# Patient Record
Sex: Male | Born: 1990 | Race: White | Hispanic: No | Marital: Single | Smoking: Never smoker
Health system: Southern US, Community
[De-identification: ages and names within clinical notes are randomized; demographics above are authoritative.]

## PROBLEM LIST (undated history)

## (undated) DIAGNOSIS — F32A Depression, unspecified: Secondary | ICD-10-CM

## (undated) DIAGNOSIS — F329 Major depressive disorder, single episode, unspecified: Secondary | ICD-10-CM

## (undated) DIAGNOSIS — Z8489 Family history of other specified conditions: Secondary | ICD-10-CM

## (undated) DIAGNOSIS — K5120 Ulcerative (chronic) proctitis without complications: Secondary | ICD-10-CM

## (undated) HISTORY — PX: WISDOM TOOTH EXTRACTION: SHX21

## (undated) HISTORY — PX: SHOULDER SURGERY: SHX246

## (undated) HISTORY — DX: Ulcerative (chronic) proctitis without complications: K51.20

## (undated) HISTORY — DX: Depression, unspecified: F32.A

## (undated) HISTORY — PX: MYRINGOTOMY: SUR874

## (undated) HISTORY — DX: Major depressive disorder, single episode, unspecified: F32.9

---
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
## 2012-03-04 ENCOUNTER — Other Ambulatory Visit: Payer: Self-pay | Admitting: Pediatrics

## 2012-03-04 DIAGNOSIS — K5120 Ulcerative (chronic) proctitis without complications: Secondary | ICD-10-CM

## 2012-03-06 NOTE — Telephone Encounter (Signed)
Here's one 

## 2013-03-14 ENCOUNTER — Telehealth: Payer: Self-pay | Admitting: Pediatrics

## 2013-03-14 NOTE — Telephone Encounter (Signed)
Here's one 

## 2013-03-26 ENCOUNTER — Other Ambulatory Visit: Payer: Self-pay | Admitting: Pediatrics

## 2013-03-26 DIAGNOSIS — K5120 Ulcerative (chronic) proctitis without complications: Secondary | ICD-10-CM

## 2013-03-26 MED ORDER — MESALAMINE ER 500 MG PO CPCR: 1000.0000 mg | ORAL_CAPSULE | Freq: Two times a day (BID) | ORAL | Status: DC

## 2013-04-02 NOTE — Telephone Encounter (Signed)
Dr Chestine Spore talked to [facility]

## 2013-12-30 IMAGING — US US ABDOMEN COMPLETE
1 series · 14 of 25 positions shown · non-contrast
Comparison: None

CLINICAL DATA: Abdominal pain.  Ulcerative proctitis.

COMPLETE ABDOMINAL ULTRASOUND

[Series 1: us abdomen complete · 0.22mm/px · 14 of 89 slices shown]
[im 1/89]
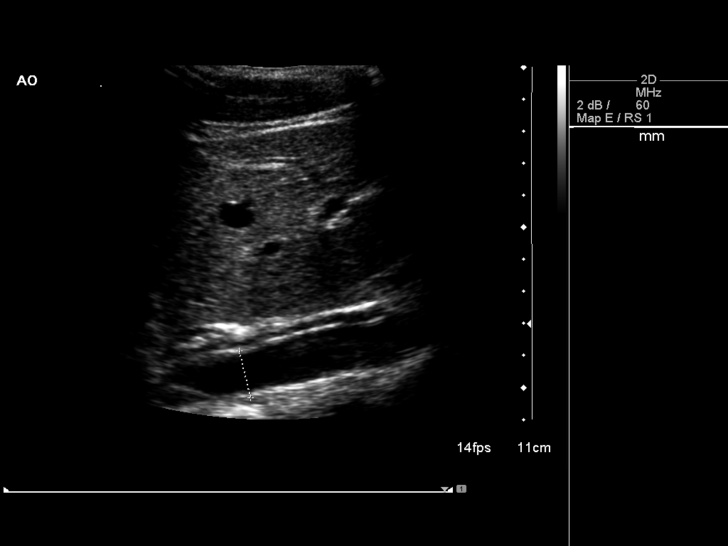
[im 8/89]
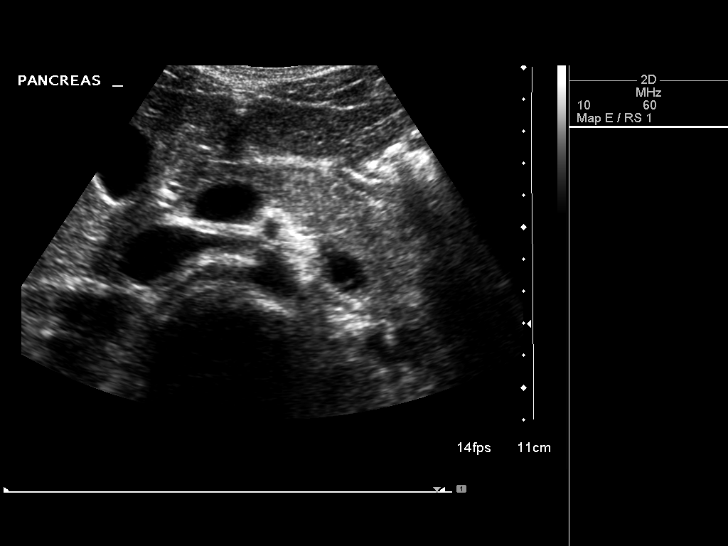
[im 15/89]
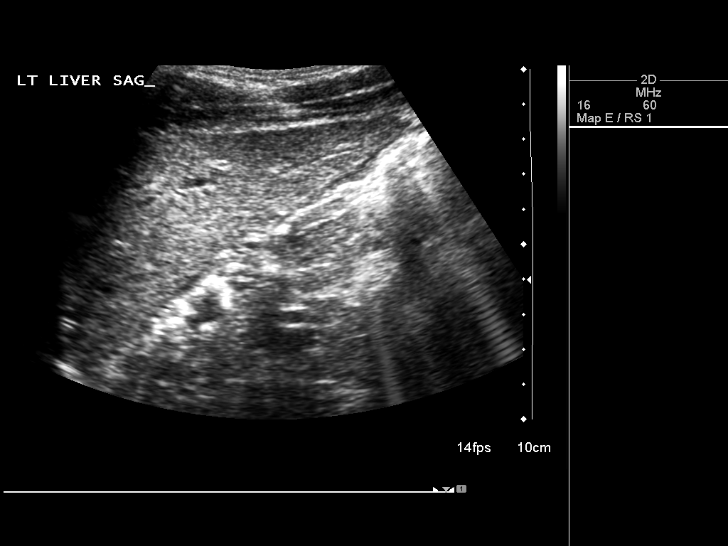
[im 23/89]
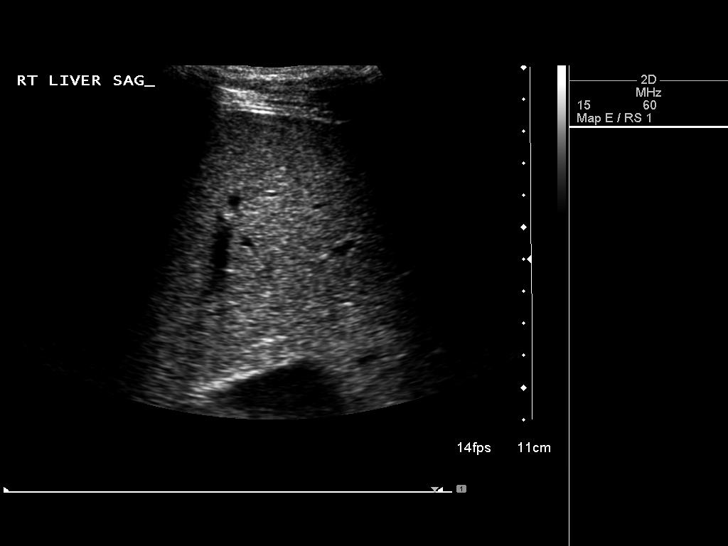
[im 30/89]
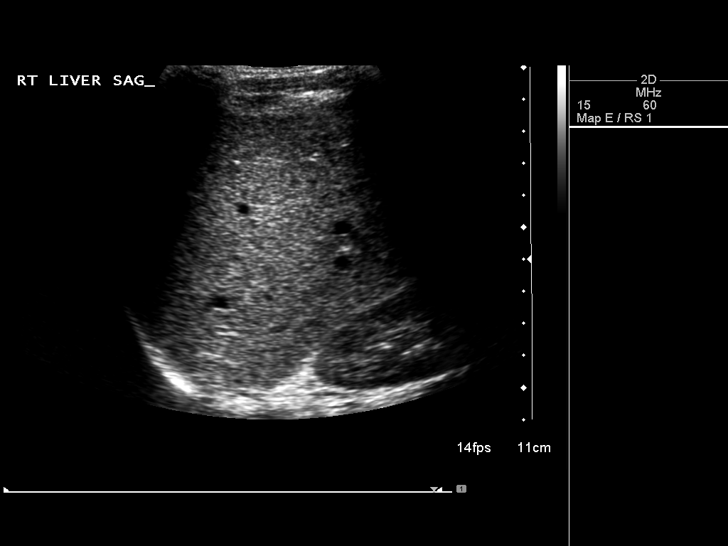
[im 34/89]
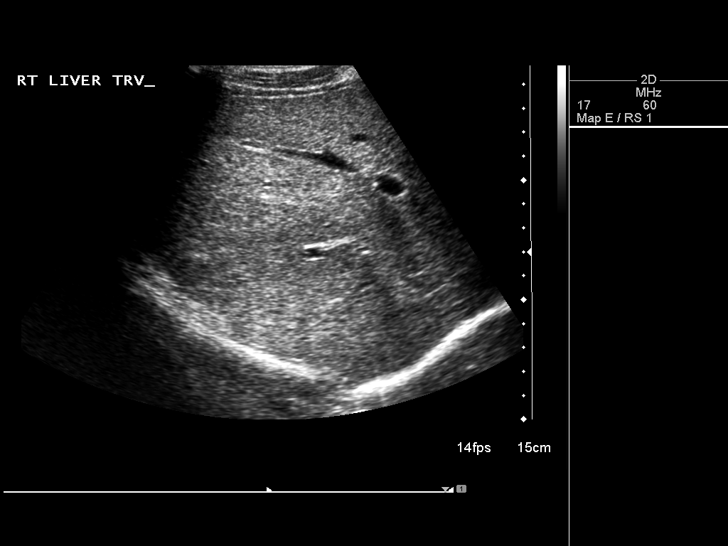
[im 41/89]
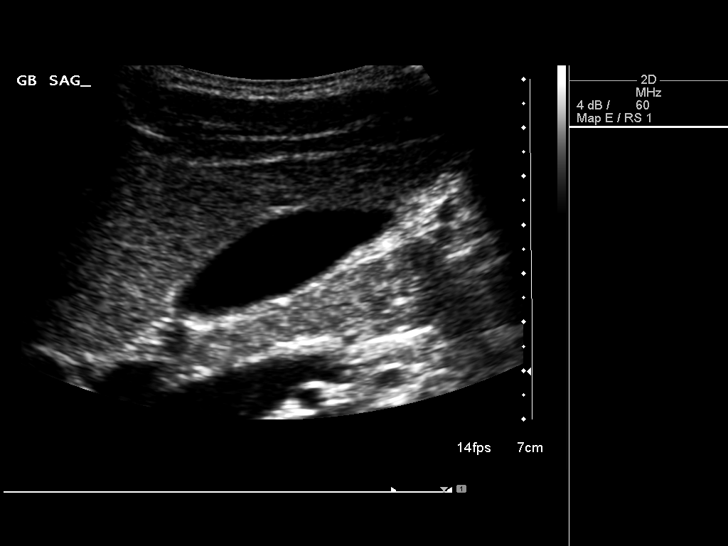
[im 48/89]
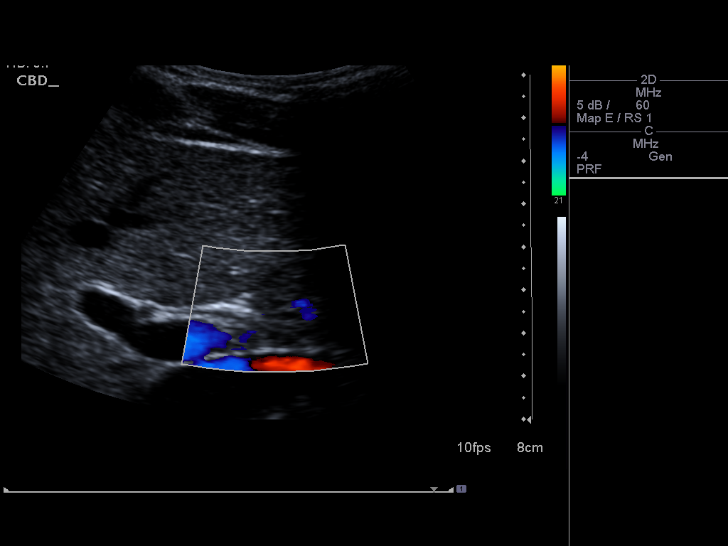
[im 56/89]
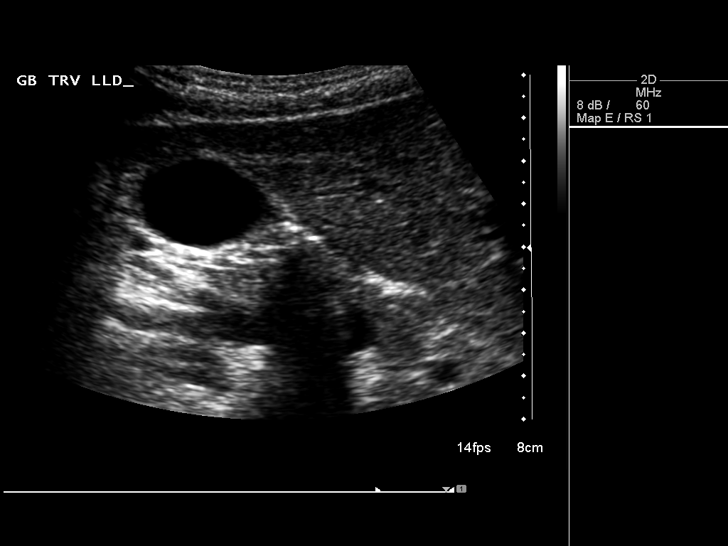
[im 59/89]
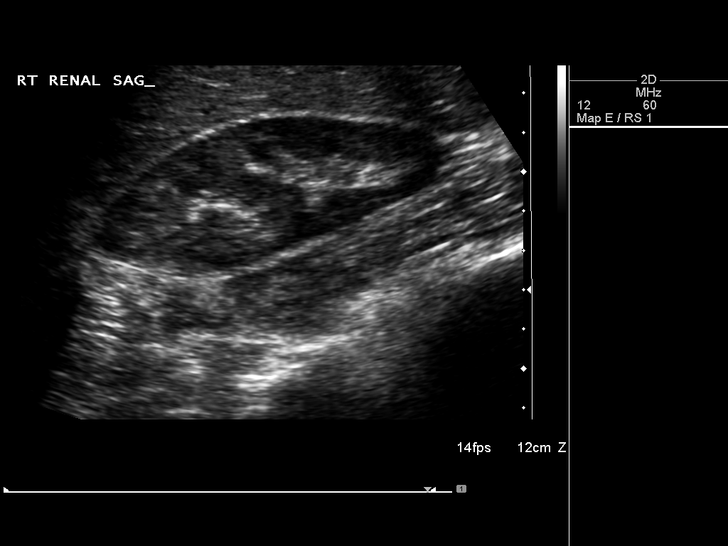
[im 67/89]
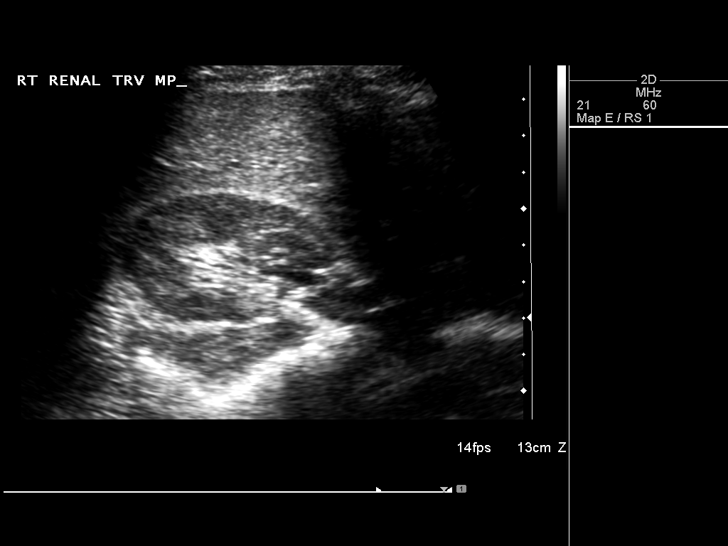
[im 74/89]
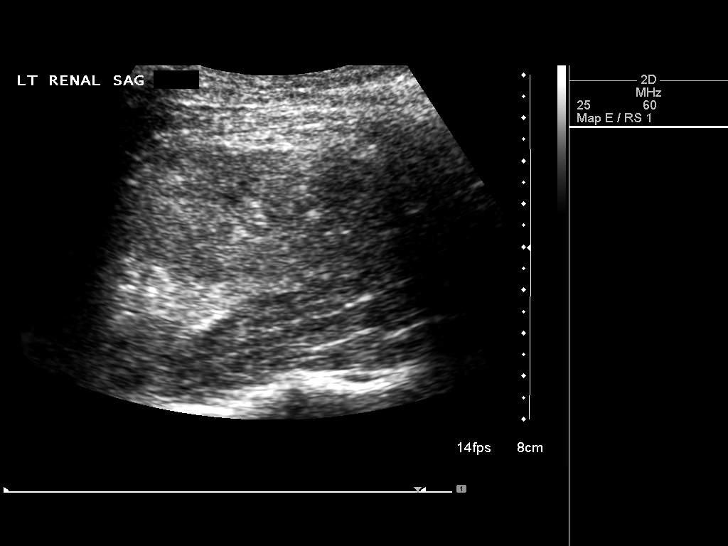
[im 81/89]
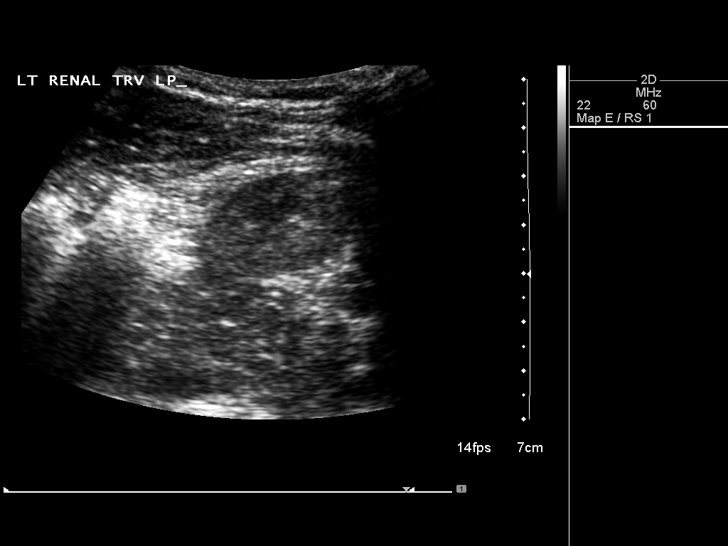
[im 89/89]
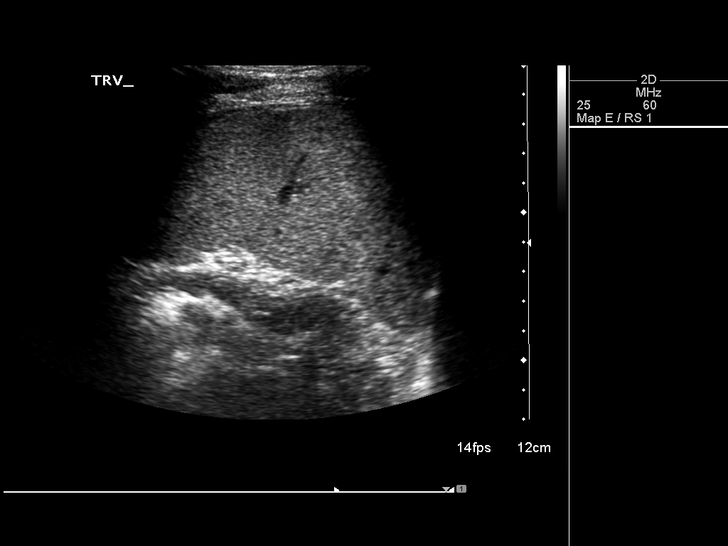

[14 of 25 positions shown; findings below may reference images not displayed]

FINDINGS: Gallbladder: Well distended without wall thickening, stones or
pericholecystic fluid. Negative sonographic Murphy's sign.

Common bile duct:   Normal in caliber without filling defects.

Liver:  Echogenicity is within normal limits.  No focal hepatic
abnormalities are identified.

IVC:  Visualized portions appear unremarkable.

Pancreas:  Visualized portions appear unremarkable.

Spleen:  Visualized portions appear unremarkable.

Right Kidney:   The renal cortical thickness and echogenicity are
preserved.  There is no hydronephrosis or focal abnormality. Renal
length is 11.2 cm.

Left Kidney:   The renal cortical thickness and echogenicity are
preserved.  There is no hydronephrosis or focal abnormality. Renal
length is 11.0 cm.

Abdominal aorta:  Visualized portions appear unremarkable. The
iliac arteries are obscured by bowel gas.
IMPRESSION: Normal abdominal ultrasound.

## 2014-01-30 ENCOUNTER — Telehealth: Payer: Self-pay | Admitting: Pediatrics

## 2014-01-30 NOTE — Telephone Encounter (Signed)
Records faxed. KW 

## 2014-11-06 ENCOUNTER — Encounter: Payer: Self-pay | Admitting: Internal Medicine

## 2014-11-06 ENCOUNTER — Ambulatory Visit (INDEPENDENT_AMBULATORY_CARE_PROVIDER_SITE_OTHER): Payer: BLUE CROSS/BLUE SHIELD | Admitting: Internal Medicine

## 2014-11-06 VITALS — BP 110/70 | HR 70 | Ht 68.25 in | Wt 118.1 lb

## 2014-11-06 DIAGNOSIS — R1084 Generalized abdominal pain: Secondary | ICD-10-CM

## 2014-11-06 DIAGNOSIS — K625 Hemorrhage of anus and rectum: Secondary | ICD-10-CM

## 2014-11-06 DIAGNOSIS — K5190 Ulcerative colitis, unspecified, without complications: Secondary | ICD-10-CM

## 2014-11-06 MED ORDER — NA SULFATE-K SULFATE-MG SULF 17.5-3.13-1.6 GM/177ML PO SOLN: 1.0000 | Freq: Once | ORAL | Status: DC

## 2014-11-06 MED ORDER — MESALAMINE 1000 MG RE SUPP: 1000.0000 mg | Freq: Every day | RECTAL | Status: DC

## 2014-11-06 NOTE — Patient Instructions (Signed)
You have been scheduled for a colonoscopy. Please follow written instructions given to you at your visit today.  Please pick up your prep supplies at the pharmacy within the next 1-3 days. If you use inhalers (even only as needed), please bring them with you on the day of your procedure.   

## 2014-11-06 NOTE — Progress Notes (Signed)
HISTORY OF PRESENT ILLNESS:  Miguel [name] is a 24 y.o. male , recent [employer] graduate, who is self-referred with a chief complaint of ulcerative proctitis flareup, change in bowel habits, and rectal bleeding. He is accompanied by his mother. Electronic medical record was reviewed. Previous patient of Dr. Chestine Spore. It appears that he was diagnosed with ulcerative proctitis in 2008. I do not have colonoscopy reports but pathology reports from June 2008 and November 2008 reveal proctocolitis distally with normal biopsies proximal. The patient tells me that he was on prednisone for one year in addition to Pentasa and Canasa suppositories. Was lost to follow-up approximately 2 years ago. Has been on no medication. Has old prescriptions. Reports that his problems began in May with increased bowel frequency associated with bleeding, gas, urgency, and mucus. Some lower abdominal cramping pain. No fevers or weight loss. The patient also had EGD in 2013 July to evaluate epigastric pain. The examination was normal. Biopsies of the esophagus, stomach, and duodenum were normal  REVIEW OF SYSTEMS:  All non-GI ROS negative except for shoulder pain  Past Medical History  Diagnosis Date  . 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 Family history of anesthesia complication     Father- during being issued a shoulder block preop, had seizure & "died", woke up in ICU 2-3 days later.  . Depression     Past Surgical History  Procedure Laterality Date  . Colonoscopy w/ biopsies  02-24-07    dx- ulcerative proctitis  . Shoulder surgery      labrimal tears- bilateral, 2011 & 2010  . Myringotomy      as a 24 y.o.  . Wisdom tooth extraction    . Esophagogastroduodenoscopy  10/29/2011    Procedure: ESOPHAGOGASTRODUODENOSCOPY (EGD);  Surgeon: Jon Gills, MD;  Location: [facility] OR;  Service: Gastroenterology;  Laterality: N/A;    Social History Miguel [name]  reports that he has never smoked. He has never  used smokeless tobacco. He reports that he drinks about 0.6 oz of alcohol per week. He reports that he does not use illicit drugs.  family history includes Diabetes in his father; Inflammatory bowel disease in his mother; Irritable bowel syndrome in his maternal grandmother; Ulcerative colitis in his mother.  No Known Allergies     PHYSICAL EXAMINATION: Vital signs: BP 110/70 mmHg  Pulse 70  Ht 5' 8.25" (1.734 m)  Wt 118 lb 2 oz (53.581 kg)  BMI 17.82 kg/m2  Constitutional: generally well-appearing, no acute distress Psychiatric: alert and oriented x3, cooperative Eyes: extraocular movements intact, anicteric, conjunctiva pink Mouth: oral pharynx moist, no lesions Neck: supple no lymphadenopathy Cardiovascular: heart regular rate and rhythm, no murmur Lungs: clear to auscultation bilaterally Abdomen: soft, nontender, nondistended, no obvious ascites, no peritoneal signs, normal bowel sounds, no organomegaly Rectal: Deferred until colonoscopy Extremities: no clubbing cyanosis or edema lower extremity edema bilaterally Skin: no lesions on visible extremities Neuro: No focal deficits. No asterixis.    ASSESSMENT:  #1. History of ulcerative proctitis remotely. 2008. Now with acute colitis symptoms #2. Normal EGD 2013  PLAN:  #1. CBC, comprehensive metabolic panel, C-reactive protein today #2. Prescribe Canasa suppositories 1 g at bedtime per rectum #3. Schedule colonoscopy with biopsies to evaluate extent of colitis. Rule out extension. Help guide management.The nature of the procedure, as well as the risks, benefits, and alternatives were carefully and thoroughly reviewed with the patient. Ample time for discussion and questions allowed. The patient understood, was satisfied,  and agreed to proceed.

## 2014-11-07 ENCOUNTER — Other Ambulatory Visit (INDEPENDENT_AMBULATORY_CARE_PROVIDER_SITE_OTHER): Payer: BLUE CROSS/BLUE SHIELD

## 2014-11-07 DIAGNOSIS — K5190 Ulcerative colitis, unspecified, without complications: Secondary | ICD-10-CM | POA: Diagnosis not present

## 2014-11-07 LAB — CBC WITH DIFFERENTIAL/PLATELET
Basophils Absolute: 0 10*3/uL (ref 0.0–0.1)
Basophils Relative: 0.3 % (ref 0.0–3.0)
Eosinophils Absolute: 0.1 10*3/uL (ref 0.0–0.7)
Eosinophils Relative: 1 % (ref 0.0–5.0)
HEMATOCRIT: 44.1 % (ref 39.0–52.0)
Hemoglobin: 14.9 g/dL (ref 13.0–17.0)
LYMPHS ABS: 1.7 10*3/uL (ref 0.7–4.0)
LYMPHS PCT: 17.7 % (ref 12.0–46.0)
MCHC: 33.8 g/dL (ref 30.0–36.0)
MCV: 88.3 fl (ref 78.0–100.0)
MONO ABS: 0.5 10*3/uL (ref 0.1–1.0)
Monocytes Relative: 5.5 % (ref 3.0–12.0)
Neutro Abs: 7 10*3/uL (ref 1.4–7.7)
Neutrophils Relative %: 75.5 % (ref 43.0–77.0)
PLATELETS: 248 10*3/uL (ref 150.0–400.0)
RBC: 4.99 Mil/uL (ref 4.22–5.81)
RDW: 13.7 % (ref 11.5–15.5)
WBC: 9.3 10*3/uL (ref 4.0–10.5)

## 2014-11-07 LAB — COMPREHENSIVE METABOLIC PANEL
ALK PHOS: 93 U/L (ref 39–117)
ALT: 11 U/L (ref 0–53)
AST: 16 U/L (ref 0–37)
Albumin: 3.8 g/dL (ref 3.5–5.2)
BUN: 14 mg/dL (ref 6–23)
CALCIUM: 9.3 mg/dL (ref 8.4–10.5)
CHLORIDE: 103 meq/L (ref 96–112)
CO2: 30 mEq/L (ref 19–32)
Creatinine, Ser: 1.17 mg/dL (ref 0.40–1.50)
GFR: 82.57 mL/min (ref >60.00)
GLUCOSE: 137 mg/dL — AB (ref 70–99)
POTASSIUM: 3.6 meq/L (ref 3.5–5.1)
SODIUM: 138 meq/L (ref 135–145)
Total Bilirubin: 0.7 mg/dL (ref 0.2–1.2)
Total Protein: 7.2 g/dL (ref 6.0–8.3)

## 2014-11-07 LAB — HIGH SENSITIVITY CRP: CRP HIGH SENSITIVITY: 2.29 mg/L (ref 0.000–5.000)

## 2015-01-27 ENCOUNTER — Encounter: Payer: BLUE CROSS/BLUE SHIELD | Admitting: Internal Medicine

## 2015-03-14 ENCOUNTER — Telehealth: Payer: Self-pay | Admitting: Internal Medicine

## 2015-03-14 ENCOUNTER — Encounter: Payer: Self-pay | Admitting: Internal Medicine

## 2015-03-14 ENCOUNTER — Ambulatory Visit (AMBULATORY_SURGERY_CENTER): Payer: BLUE CROSS/BLUE SHIELD | Admitting: Internal Medicine

## 2015-03-14 ENCOUNTER — Telehealth: Payer: Self-pay | Admitting: *Deleted

## 2015-03-14 VITALS — BP 121/70 | HR 79 | Temp 97.2°F | Resp 12 | Ht 68.0 in | Wt 118.0 lb

## 2015-03-14 DIAGNOSIS — K5190 Ulcerative colitis, unspecified, without complications: Secondary | ICD-10-CM | POA: Diagnosis not present

## 2015-03-14 DIAGNOSIS — K529 Noninfective gastroenteritis and colitis, unspecified: Secondary | ICD-10-CM | POA: Diagnosis not present

## 2015-03-14 MED ORDER — PREDNISONE 20 MG PO TABS: ORAL_TABLET | ORAL | Status: AC

## 2015-03-14 MED ORDER — SODIUM CHLORIDE 0.9 % IV SOLN: 500.0000 mL | INTRAVENOUS | Status: DC

## 2015-03-14 NOTE — Op Note (Signed)
[facility] [address] [place], [id number]   COLONOSCOPY PROCEDURE REPORT  PATIENT: Miguel [name], [name]  MR#: [id number] BIRTHDATE: 1990-12-22 , 24  yrs. old GENDER: male ENDOSCOPIST: Roxy CedarJohn N Perry Jr, MD REFERRED BY:.  Self / Office PROCEDURE DATE:  03/14/2015 PROCEDURE:   Colonoscopy with biopsy  ASA CLASS:   Class II INDICATIONS:high risk patient with previously diagnosed UC(proctitis). Previous colonoscopy 2008. Dr. Chestine Sporelark. Recent exacerbation in symptoms. Reassess disease activity and extent. MEDICATIONS: Monitored anesthesia care and Propofol 300 mg IV  DESCRIPTION OF PROCEDURE:   After the risks benefits and alternatives of the procedure were thoroughly explained, informed consent was obtained.  The digital rectal exam revealed no abnormalities of the rectum.   The LB [id number]  endoscope was introduced through the anus and advanced to the cecum, which was identified by both the appendix and ileocecal valve. No adverse events experienced.   The quality of the prep was good.  (Suprep was used)  The instrument was then slowly withdrawn as the colon was fully examined. Estimated blood loss is zero unless otherwise noted in this procedure report.  COLON FINDINGS: The colonoscope was advanced to the cecal tip.  The mucosa in the cecum, right colon, transverse colon, descending colon and proximal sigmoid colon appeared grossly normal.  Multiple biopsies from each section taken and submitted for histologic analysis.  The distal 15 cm of the colon (rectum) revealed moderately active confluent colitis with granularity,edema, friability, and spontaneous hemorrhage.multiple biopsies taken. Retroflexion was not performed due to a narrow rectal vault. The time to cecum = 4 Withdrawal time = 9   The scope was withdrawn and the procedure completed. COMPLICATIONS: There were no immediate complications.  ENDOSCOPIC IMPRESSION: 1. Acute ulcerative proctitis,  moderately severe, involving the distal 15 cm. 2. Normal colonic mucosa proximal to the area of proctitis  RECOMMENDATIONS: 1.  Resume Canasa suppositories at night, each night, until further notice 2.  Prescribe prednisone 20 mg; #60; take 30 mg once daily for 2 weeks, then 20 mg for 2 weeks, then 10 mg for 2 weeks, then discontinue 3.  Await biopsy results  . Dr. Marina GoodellPerry will send you a letter with the results and additional recommendations (if any) 4. Schedule a follow-up office appointment with Dr. Marina GoodellPerry in 4-6 weeks  eSigned:  Roxy CedarJohn N Perry Jr, MD 03/14/2015 3:30 PM   cc: The Patient    ; Jonetta SpeakWarren Bonney

## 2015-03-14 NOTE — Telephone Encounter (Signed)
Patient vomited half of his second prep.  States that his stool is watery brown at this time.  He is to call us back in one hour if the second prep doesn't work.  At that time, the dr will be contacted.

## 2015-03-14 NOTE — Progress Notes (Signed)
Report to PACU, RN, vss, BBS= Clear.  

## 2015-03-14 NOTE — Progress Notes (Signed)
Called to room to assist during endoscopic procedure.  Patient ID and intended procedure confirmed with present staff. Received instructions for my participation in the procedure from the performing physician.  

## 2015-03-14 NOTE — Patient Instructions (Addendum)
YOU HAD AN ENDOSCOPIC PROCEDURE TODAY AT THE [facility]:   Refer to the procedure report that was given to you for any specific questions about what was found during the examination.  If the procedure report does not answer your questions, please call your gastroenterologist to clarify.  If you requested that your care partner not be given the details of your procedure findings, then the procedure report has been included in a sealed envelope for you to review at your convenience later.  YOU SHOULD EXPECT: Some feelings of bloating in the abdomen. Passage of more gas than usual.  Walking can help get rid of the air that was put into your GI tract during the procedure and reduce the bloating. If you had a lower endoscopy (such as a colonoscopy or flexible sigmoidoscopy) you may notice spotting of blood in your stool or on the toilet paper. If you underwent a bowel prep for your procedure, you may not have a normal bowel movement for a few days.  Please Note:  You might notice some irritation and congestion in your nose or some drainage.  This is from the oxygen used during your procedure.  There is no need for concern and it should clear up in a day or so.  SYMPTOMS TO REPORT IMMEDIATELY:   Following lower endoscopy (colonoscopy or flexible sigmoidoscopy):  Excessive amounts of blood in the stool  Significant tenderness or worsening of abdominal pains  Swelling of the abdomen that is new, acute  Fever of 100F or higher    For urgent or emergent issues, a gastroenterologist can be reached at any hour by calling [phone].   DIET: Your first meal following the procedure should be a small meal and then it is ok to progress to your normal diet. Heavy or fried foods are harder to digest and may make you feel nauseous or bloated.  Likewise, meals heavy in dairy and vegetables can increase bloating.  Drink plenty of fluids but you should avoid alcoholic beverages for 24  hours.  ACTIVITY:  You should plan to take it easy for the rest of today and you should NOT DRIVE or use heavy machinery until tomorrow (because of the sedation medicines used during the test).    FOLLOW UP: Our staff will call the number listed on your records the next business day following your procedure to check on you and address any questions or concerns that you may have regarding the information given to you following your procedure. If we do not reach you, we will leave a message.  However, if you are feeling well and you are not experiencing any problems, there is no need to return our call.  We will assume that you have returned to your regular daily activities without incident.  If any biopsies were taken you will be contacted by phone or by letter within the next 1-3 weeks.  Please call us at [phone] if you have not heard about the biopsies in 3 weeks.    SIGNATURES/CONFIDENTIALITY: You and/or your care partner have signed paperwork which will be entered into your electronic medical record.  These signatures attest to the fact that that the information above on your After Visit Summary has been reviewed and is understood.  Full responsibility of the confidentiality of this discharge information lies with you and/or your care-partner.   INFORMATION ON ULCERATIVE COLITIS GIVEN TO YOU  Await biopsy results  [facility]   Resume Canasa suppositories  each night until further notice

## 2015-03-17 ENCOUNTER — Telehealth: Payer: Self-pay | Admitting: *Deleted

## 2015-03-17 NOTE — Telephone Encounter (Signed)
  Follow up Call-  Call back number 03/14/2015  Post procedure Call Back phone  # [phone]  Permission to leave phone message Yes     Patient questions:  Do you have a fever, pain , or abdominal swelling? No. Pain Score  0 *  Have you tolerated food without any problems? Yes.    Have you been able to return to your normal activities? Yes.    Do you have any questions about your discharge instructions: Diet   No. Medications  No. Follow up visit  No.  Do you have questions or concerns about your Care? No.  Actions: * If pain score is 4 or above: No action needed, pain <4.

## 2015-03-18 ENCOUNTER — Encounter: Payer: Self-pay | Admitting: Internal Medicine

## 2015-04-24 ENCOUNTER — Encounter: Payer: Self-pay | Admitting: Internal Medicine

## 2015-04-24 ENCOUNTER — Ambulatory Visit (INDEPENDENT_AMBULATORY_CARE_PROVIDER_SITE_OTHER): Payer: BLUE CROSS/BLUE SHIELD | Admitting: Internal Medicine

## 2015-04-24 VITALS — BP 98/60 | HR 84 | Ht 68.25 in | Wt 121.2 lb

## 2015-04-24 DIAGNOSIS — K5120 Ulcerative (chronic) proctitis without complications: Secondary | ICD-10-CM

## 2015-04-24 NOTE — Patient Instructions (Signed)
Per Dr. Marina Goodell, finish your Prednisone taper and continue your Canasa  Please follow up in 6 months

## 2015-04-24 NOTE — Progress Notes (Signed)
HISTORY OF PRESENT ILLNESS:  Miguel [name] is a 25 y.o. male with a history of ulcerative proctitis diagnosed in 2008 was evaluated as a new patient on 11/06/2014 for symptoms consistent with a flare of his disease. This after being lost to medical follow-up. Blood work at that time including CBC, comprehensive metabolic panel, and C-reactive protein were unremarkable. He was prescribed Canasa suppositories and scheduled for colonoscopy which was performed 03/14/2015. He was found to have moderately severe acute ulcerative proctitis involving the distal 15 cm. Biopsies confirmed the same. No dysplasia. The colon proximal to this region was normal endoscopically and microscopically on biopsy. He was prescribed prednisone starting at 30 mg with a 10 mg taper every 2 weeks. He was also instructed to be compliant with Canasa suppositories each night. He delayed starting his prednisone after receiving influenza vaccination at pharmacy. The most part he has been compliant with prednisone though missed several doses when traveling. Canasa suppository use has been much more sporadic. Maybe 3-4 times per week. Overall he has had significant improvement in his symptoms. He describes one to 2 mostly formed bowel movements without mucus or blood. He does have increased gas and abdominal cramping. He states that he is 70% better. No new issues. He tolerated his medications well. Patient does tell me that he had redness of his eye and was evaluated by local ophthalmologist at [facility]. Tells me that he was diagnosed with an IBD-related ocular  disorder which was treated with drops and resolved  REVIEW OF SYSTEMS:  All non-GI ROS negative except for visual problems (improved)  Past Medical History  Diagnosis Date  . Ulcerative proctitis (HCC) 02-24-07    Dx- colonoscopy/biopsy   . Family history of anesthesia complication     Father- during being issued a shoulder block preop, had seizure & "died", woke up in ICU 2-3  days later.  . Depression     Past Surgical History  Procedure Laterality Date  . Colonoscopy w/ biopsies  02-24-07    dx- ulcerative proctitis  . Shoulder surgery      labrimal tears- bilateral, 2011 & 2010  . Myringotomy      as a 25 y.o.  . Wisdom tooth extraction    . Esophagogastroduodenoscopy  10/29/2011    Procedure: ESOPHAGOGASTRODUODENOSCOPY (EGD);  Surgeon: Jon Gills, MD;  Location: [facility] OR;  Service: Gastroenterology;  Laterality: N/A;    Social History AAHIL FREDIN  reports that he has never smoked. He has never used smokeless tobacco. He reports that he drinks about 0.6 oz of alcohol per week. He reports that he does not use illicit drugs.  family history includes Diabetes in his father; Inflammatory bowel disease in his mother; Irritable bowel syndrome in his maternal grandmother; Ulcerative colitis in his mother.  No Known Allergies     PHYSICAL EXAMINATION: Vital signs: BP 98/60 mmHg  Pulse 84  Ht 5' 8.25" (1.734 m)  Wt 121 lb 4 oz (54.999 kg)  BMI 18.29 kg/m2 General: Thin white male, no acute distress HEENT: Sclerae are anicteric, conjunctiva pink. No obvious abnormality of the eye. Oral mucosa intact Lungs: Clear Heart: Regular Abdomen: soft, nontender, nondistended, no obvious ascites, no peritoneal signs, normal bowel sounds. No organomegaly. Extremities: No clubbing cyanosis or edema Psychiatric: alert and oriented x3. Cooperative   ASSESSMENT:  #1. Ulcerative proctitis. Improved with course of prednisone. Now on Canasa suppositories, albeit sporadically  PLAN:  #1. Complete prednisone taper. He has approximately one-week remaining at 10 mg  daily, then stop #2. Advised to take Canasa suppositories at night, each night, until he feels that he is at baseline. At that point, continue for one week then stop. #3. Advised how to use suppositories on demand for flares #4. Advised contact the office should he have severe flare flare not responding to  suppository therapy #5. Otherwise routine office follow-up in 6 months #6. Continue to see ophthalmologist as needed for any visual or ocular disturbances whatsoever  25 minutes was spent face-to-face with the patient. Greater than 50% of the time use for counseling regarding his ulcerative proctitis

## 2016-04-13 ENCOUNTER — Telehealth: Payer: Self-pay | Admitting: Internal Medicine

## 2016-04-14 MED ORDER — MESALAMINE 1000 MG RE SUPP: 1000.0000 mg | Freq: Every day | RECTAL | 3 refills | Status: AC

## 2016-04-14 NOTE — Telephone Encounter (Signed)
Spoke with patient who stated he has moved to [place] and not sure if he is going to stay with [facility].  I told patient I would go ahead and refill his Canasa for now and he could call when he had made a final decision regarding doctors.  Patient agreed

## 2017-03-03 ENCOUNTER — Telehealth: Payer: Self-pay | Admitting: Internal Medicine

## 2017-03-04 NOTE — Telephone Encounter (Signed)
Check with pharmacy to see if there is equivalent products

## 2017-03-04 NOTE — Telephone Encounter (Signed)
Please advise 

## 2017-03-22 NOTE — Telephone Encounter (Signed)
No answer

## 2017-04-22 ENCOUNTER — Telehealth: Payer: Self-pay | Admitting: Internal Medicine

## 2017-04-22 NOTE — Telephone Encounter (Signed)
Referral and records faxed to Dr. Meredith ModyStein at [phone].
